# Patient Record
Sex: Female | Born: 1965 | Race: White | Hispanic: No | Marital: Single | Smoking: Never smoker
Health system: Southern US, Community
[De-identification: ages and names within clinical notes are randomized; demographics above are authoritative.]

## PROBLEM LIST (undated history)

## (undated) HISTORY — PX: FOOT BONE EXCISION: SUR493

---

## 2016-08-06 ENCOUNTER — Ambulatory Visit: Payer: BLUE CROSS/BLUE SHIELD

## 2016-08-06 ENCOUNTER — Emergency Department (INDEPENDENT_AMBULATORY_CARE_PROVIDER_SITE_OTHER): Payer: BLUE CROSS/BLUE SHIELD

## 2016-08-06 ENCOUNTER — Emergency Department
Admission: EM | Admit: 2016-08-06 | Discharge: 2016-08-06 | Disposition: A | Discharge status: Home / Self Care | Payer: BLUE CROSS/BLUE SHIELD | Source: Home / Self Care | Attending: Family Medicine | Admitting: Family Medicine

## 2016-08-06 DIAGNOSIS — M546 Pain in thoracic spine: Secondary | ICD-10-CM

## 2016-08-06 DIAGNOSIS — M545 Low back pain, unspecified: Secondary | ICD-10-CM

## 2016-08-06 DIAGNOSIS — T148XXA Other injury of unspecified body region, initial encounter: Secondary | ICD-10-CM

## 2016-08-06 MED ORDER — CYCLOBENZAPRINE HCL 10 MG PO TABS: 10.0000 mg | ORAL_TABLET | Freq: Two times a day (BID) | ORAL | 0 refills | Status: AC | PRN

## 2016-08-06 MED ORDER — IBUPROFEN 600 MG PO TABS: 600.0000 mg | ORAL_TABLET | Freq: Four times a day (QID) | ORAL | 0 refills | Status: AC | PRN

## 2016-08-06 MED ORDER — HYDROCODONE-ACETAMINOPHEN 5-325 MG PO TABS: 1.0000 | ORAL_TABLET | Freq: Four times a day (QID) | ORAL | 0 refills | Status: AC | PRN

## 2016-08-06 MED ORDER — KETOROLAC TROMETHAMINE 60 MG/2ML IM SOLN
60.0000 mg | Freq: Once | INTRAMUSCULAR | Status: AC
  Administered 2016-08-06: 60 mg via INTRAMUSCULAR

## 2016-08-06 MED ORDER — METHYLPREDNISOLONE SODIUM SUCC 40 MG IJ SOLR
80.0000 mg | Freq: Once | INTRAMUSCULAR | Status: AC
  Administered 2016-08-06: 80 mg via INTRAMUSCULAR
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
## 2016-08-06 NOTE — Discharge Instructions (Signed)
°  Norco/Vicodin (hydrocodone-acetaminophen) is a narcotic pain medication, do not combine these medications with others containing tylenol. While taking, do not drink alcohol, drive, or perform any other activities that requires focus while taking these medications.  ° °Flexeril is a muscle relaxer and may cause drowsiness. Do not drink alcohol, drive, or operate heavy machinery while taking. ° °
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
